# Patient Record
Sex: Female | Born: 1985 | Race: Black or African American | Hispanic: No | Smoking: Never smoker
Health system: Southern US, Community
[De-identification: ages and names within clinical notes are randomized; demographics above are authoritative.]

## PROBLEM LIST (undated history)

## (undated) HISTORY — PX: CHOLECYSTECTOMY: SHX55

## (undated) HISTORY — PX: TUBAL LIGATION: SHX77

---

## 2019-10-18 DIAGNOSIS — Z23 Encounter for immunization: Secondary | ICD-10-CM | POA: Diagnosis not present

## 2020-01-06 DIAGNOSIS — Z87440 Personal history of urinary (tract) infections: Secondary | ICD-10-CM | POA: Diagnosis not present

## 2020-01-06 DIAGNOSIS — R350 Frequency of micturition: Secondary | ICD-10-CM | POA: Diagnosis not present

## 2020-01-06 DIAGNOSIS — R8290 Unspecified abnormal findings in urine: Secondary | ICD-10-CM | POA: Diagnosis not present

## 2020-01-31 DIAGNOSIS — J029 Acute pharyngitis, unspecified: Secondary | ICD-10-CM | POA: Diagnosis not present

## 2020-02-17 ENCOUNTER — Other Ambulatory Visit: Payer: Self-pay

## 2020-02-17 ENCOUNTER — Encounter: Payer: Self-pay | Admitting: Advanced Practice Midwife

## 2020-02-17 ENCOUNTER — Ambulatory Visit (INDEPENDENT_AMBULATORY_CARE_PROVIDER_SITE_OTHER): Payer: Medicaid Other | Admitting: Advanced Practice Midwife

## 2020-02-17 ENCOUNTER — Other Ambulatory Visit (HOSPITAL_COMMUNITY)
Admission: RE | Admit: 2020-02-17 | Discharge: 2020-02-17 | Disposition: A | Discharge status: Home / Self Care | Payer: Medicaid Other | Source: Ambulatory Visit | Attending: Advanced Practice Midwife | Admitting: Advanced Practice Midwife

## 2020-02-17 VITALS — BP 111/72 | HR 64 | Ht 65.0 in | Wt 212.6 lb

## 2020-02-17 DIAGNOSIS — Z113 Encounter for screening for infections with a predominantly sexual mode of transmission: Secondary | ICD-10-CM | POA: Diagnosis not present

## 2020-02-17 DIAGNOSIS — Z01419 Encounter for gynecological examination (general) (routine) without abnormal findings: Secondary | ICD-10-CM | POA: Diagnosis not present

## 2020-02-17 DIAGNOSIS — Z9851 Tubal ligation status: Secondary | ICD-10-CM | POA: Diagnosis not present

## 2020-02-17 NOTE — Patient Instructions (Signed)
Preventive Care 21-36 Years Old, Female Preventive care refers to visits with your health care provider and lifestyle choices that can promote health and wellness. This includes:  A yearly physical exam. This may also be called an annual well check.  Regular dental visits and eye exams.  Immunizations.  Screening for certain conditions.  Healthy lifestyle choices, such as eating a healthy diet, getting regular exercise, not using drugs or products that contain nicotine and tobacco, and limiting alcohol use. What can I expect for my preventive care visit? Physical exam Your health care provider will check your:  Height and weight. This may be used to calculate body mass index (BMI), which tells if you are at a healthy weight.  Heart rate and blood pressure.  Skin for abnormal spots. Counseling Your health care provider may ask you questions about your:  Alcohol, tobacco, and drug use.  Emotional well-being.  Home and relationship well-being.  Sexual activity.  Eating habits.  Work and work environment.  Method of birth control.  Menstrual cycle.  Pregnancy history. What immunizations do I need?  Influenza (flu) vaccine  This is recommended every year. Tetanus, diphtheria, and pertussis (Tdap) vaccine  You may need a Td booster every 10 years. Varicella (chickenpox) vaccine  You may need this if you have not been vaccinated. Human papillomavirus (HPV) vaccine  If recommended by your health care provider, you may need three doses over 6 months. Measles, mumps, and rubella (MMR) vaccine  You may need at least one dose of MMR. You may also need a second dose. Meningococcal conjugate (MenACWY) vaccine  One dose is recommended if you are age 19-21 years and a first-year college student living in a residence hall, or if you have one of several medical conditions. You may also need additional booster doses. Pneumococcal conjugate (PCV13) vaccine  You may need  this if you have certain conditions and were not previously vaccinated. Pneumococcal polysaccharide (PPSV23) vaccine  You may need one or two doses if you smoke cigarettes or if you have certain conditions. Hepatitis A vaccine  You may need this if you have certain conditions or if you travel or work in places where you may be exposed to hepatitis A. Hepatitis B vaccine  You may need this if you have certain conditions or if you travel or work in places where you may be exposed to hepatitis B. Haemophilus influenzae type b (Hib) vaccine  You may need this if you have certain conditions. You may receive vaccines as individual doses or as more than one vaccine together in one shot (combination vaccines). Talk with your health care provider about the risks and benefits of combination vaccines. What tests do I need?  Blood tests  Lipid and cholesterol levels. These may be checked every 5 years starting at age 20.  Hepatitis C test.  Hepatitis B test. Screening  Diabetes screening. This is done by checking your blood sugar (glucose) after you have not eaten for a while (fasting).  Sexually transmitted disease (STD) testing.  BRCA-related cancer screening. This may be done if you have a family history of breast, ovarian, tubal, or peritoneal cancers.  Pelvic exam and Pap test. This may be done every 3 years starting at age 21. Starting at age 30, this may be done every 5 years if you have a Pap test in combination with an HPV test. Talk with your health care provider about your test results, treatment options, and if necessary, the need for more tests.   Follow these instructions at home: Eating and drinking   Eat a diet that includes fresh fruits and vegetables, whole grains, lean protein, and low-fat dairy.  Take vitamin and mineral supplements as recommended by your health care provider.  Do not drink alcohol if: ? Your health care provider tells you not to drink. ? You are  pregnant, may be pregnant, or are planning to become pregnant.  If you drink alcohol: ? Limit how much you have to 0-1 drink a day. ? Be aware of how much alcohol is in your drink. In the [place] one drink equals one 12 oz bottle of beer (355 mL), one 5 oz glass of wine (148 mL), or one 1 oz glass of hard liquor (44 mL). Lifestyle  Take daily care of your teeth and gums.  Stay active. Exercise for at least 30 minutes on 5 or more days each week.  Do not use any products that contain nicotine or tobacco, such as cigarettes, e-cigarettes, and chewing tobacco. If you need help quitting, ask your health care provider.  If you are sexually active, practice safe sex. Use a condom or other form of birth control (contraception) in order to prevent pregnancy and STIs (sexually transmitted infections). If you plan to become pregnant, see your health care provider for a preconception visit. What's next?  Visit your health care provider once a year for a well check visit.  Ask your health care provider how often you should have your eyes and teeth checked.  Stay up to date on all vaccines. This information is not intended to replace advice given to you by your health care provider. Make sure you discuss any questions you have with your health care provider. Document Revised: 11/02/2017 Document Reviewed: 11/02/2017 Elsevier Patient Education  2020 [employer].

## 2020-02-17 NOTE — Progress Notes (Signed)
Subjective:     Michelle Parks is a 34 y.o. female here at [facility] for a routine exam.  Current complaints: none.  Personal health questionnaire reviewed: yes.  Do you have a primary care provider? yes Do you feel safe at home? yes   Flowsheet Row Office Visit from 02/17/2020 in [facility]  PHQ-2 Total Score 0     Risk factors for chronic health problems: Smoking: Quit 7+ years ago Alchohol/how much: socially, occasional Pt BMI: Body mass index is 35.38 kg/m.   Gynecologic History Patient's last menstrual period was 02/12/2020 (exact date). Contraception: tubal ligation Last Pap: 7+ years ago. Results were: normal Last mammogram: n/a.   Obstetric History OB History  Gravida Para Term Preterm AB Living  6       1 5   SAB IAB Ectopic Multiple Live Births    1          # Outcome Date GA Lbr Len/2nd Weight Sex Delivery Anes PTL Lv  6 Gravida           5 Gravida           4 Gravida           3 Gravida           2 Gravida           1 IAB              The following portions of the patient's history were reviewed and updated as appropriate: allergies, current medications, past family history, past medical history, past social history, past surgical history and problem list.  Review of Systems Pertinent items noted in HPI and remainder of comprehensive ROS otherwise negative.    Objective:   BP 111/72   Pulse 64   Ht 5\' 5"  (1.651 m)   Wt 212 lb 9.6 oz (96.4 kg)   LMP 02/12/2020 (Exact Date)   BMI 35.38 kg/m  VS reviewed, nursing note reviewed,  Constitutional: well developed, well nourished, no distress HEENT: normocephalic CV: normal rate Pulm/chest wall: normal effort Breast Exam: performed: right breast normal without mass, skin or nipple changes or axillary nodes, left breast normal without mass, skin or nipple changes or axillary nodes Abdomen: soft Neuro: alert and oriented x 3 Skin: warm, dry Psych: affect normal Pelvic  exam: Performed: Cervix pink, visually closed, without lesion, scant white creamy discharge, vaginal walls and external genitalia normal Bimanual exam: Cervix 0/long/high, firm, anterior, neg CMT, uterus nontender, nonenlarged, adnexa without tenderness, enlargement, or mass       Assessment/Plan:   1. Well woman exam with routine gynecological exam --No gyn concerns or problems.  Menses were heavy with more cramping in years following BTL but are lighter now and mild cramping only.  Regular menses monthly lasting ~ 7 days. --Abnormal Pap in past but last pap normal with last pregnancy ~ 7 years ago --Pap with hpv today  2. Screening examination for STD (sexually transmitted disease)   Follow up in: 1 year or as needed.   , CNM 9:44 AM

## 2020-02-17 NOTE — Progress Notes (Signed)
NGYN patient presents for Annual Exam.  LMP:02/12/20 lasting 7 days with light flow now, use to be heavy.   Last pap: > More than 3 yrs. Hx of abnormal paps in the past.   STD Screening: Desires Full Panel   Contraception: none had BTL   Family Hx of Breast Cancer: None    CC: None

## 2020-02-18 LAB — CERVICOVAGINAL ANCILLARY ONLY
Bacterial Vaginitis (gardnerella): POSITIVE — AB
Candida Glabrata: NEGATIVE
Candida Vaginitis: NEGATIVE
Chlamydia: NEGATIVE
Comment: NEGATIVE
Comment: NEGATIVE
Comment: NEGATIVE
Comment: NEGATIVE
Comment: NEGATIVE
Comment: NORMAL
Neisseria Gonorrhea: NEGATIVE
Trichomonas: NEGATIVE

## 2020-02-19 LAB — CYTOLOGY - PAP
Comment: NEGATIVE
Diagnosis: NEGATIVE
High risk HPV: NEGATIVE

## 2020-11-22 ENCOUNTER — Emergency Department (HOSPITAL_COMMUNITY)
Admission: EM | Admit: 2020-11-22 | Discharge: 2020-11-22 | Disposition: A | Discharge status: Home / Self Care | Payer: Medicaid Other | Attending: Emergency Medicine | Admitting: Emergency Medicine

## 2020-11-22 ENCOUNTER — Encounter (HOSPITAL_COMMUNITY): Payer: Self-pay

## 2020-11-22 ENCOUNTER — Other Ambulatory Visit: Payer: Self-pay

## 2020-11-22 DIAGNOSIS — B9620 Unspecified Escherichia coli [E. coli] as the cause of diseases classified elsewhere: Secondary | ICD-10-CM | POA: Insufficient documentation

## 2020-11-22 DIAGNOSIS — R350 Frequency of micturition: Secondary | ICD-10-CM

## 2020-11-22 DIAGNOSIS — N390 Urinary tract infection, site not specified: Secondary | ICD-10-CM | POA: Insufficient documentation

## 2020-11-22 DIAGNOSIS — R1030 Lower abdominal pain, unspecified: Secondary | ICD-10-CM | POA: Insufficient documentation

## 2020-11-22 LAB — URINALYSIS, ROUTINE W REFLEX MICROSCOPIC
Bilirubin Urine: NEGATIVE
Glucose, UA: NEGATIVE mg/dL
Hgb urine dipstick: NEGATIVE
Ketones, ur: NEGATIVE mg/dL
Leukocytes,Ua: NEGATIVE
Nitrite: NEGATIVE
Protein, ur: NEGATIVE mg/dL
Specific Gravity, Urine: 1.02 (ref 1.005–1.030)
pH: 7 (ref 5.0–8.0)

## 2020-11-22 MED ORDER — CEPHALEXIN 500 MG PO CAPS: 500.0000 mg | ORAL_CAPSULE | Freq: Four times a day (QID) | ORAL | 0 refills | Status: DC

## 2020-11-22 NOTE — ED Provider Notes (Signed)
[facility]   CSN: [id number] Arrival date & time: 11/22/20  1401     History Chief Complaint  Patient presents with   Possible UTI    Michelle Parks is a 35 y.o. female.  HPI  Patient reports with increased urinary frequency.  This is been going on the last 3 weeks, no hematuria or dysuria.  Patient has history of frequent UTIs, [place] ago [place] and has not yet have a primary care physician.  She reports some abdominal cramping associated with urination, it comes and goes and is classic with her presentation.  Denies any nausea or vomiting.  Has not had any fevers at home.  She has tried some Tylenol without any relief.    No past medical history on file.  Patient Active Problem List   Diagnosis Date Noted   Status post tubal ligation 02/17/2020    No past surgical history on file.   OB History     Gravida  6   Para      Term      Preterm      AB  1   Living  5      SAB      IAB  1   Ectopic      Multiple      Live Births              Family History  Problem Relation Age of Onset   Hypertension Mother    Diabetes Mother     Social History   Tobacco Use   Smoking status: Never   Smokeless tobacco: Never  Vaping Use   Vaping Use: Never used  Substance Use Topics   Alcohol use: Yes   Drug use: Never    Home Medications Prior to Admission medications   Not on File    Allergies    Patient has no allergy information on record.  Review of Systems   Review of Systems  Constitutional:  Negative for fever.  Genitourinary:  Positive for frequency and urgency. Negative for dysuria and flank pain.   Physical Exam Updated Vital Signs There were no vitals taken for this visit.  Physical Exam Vitals and nursing note reviewed. Exam conducted with a chaperone present.  Constitutional:      General: She is not in acute distress.    Appearance: Normal appearance.   HENT:     Head: Normocephalic and atraumatic.  Eyes:     General: No scleral icterus.    Extraocular Movements: Extraocular movements intact.     Pupils: Pupils are equal, round, and reactive to light.  Abdominal:     Comments: Some suprapubic tenderness.  Skin:    Coloration: Skin is not jaundiced.  Neurological:     Mental Status: She is alert. Mental status is at baseline.     Coordination: Coordination normal.    ED Results / Procedures / Treatments   Labs (all labs ordered are listed, but only abnormal results are displayed) Labs Reviewed - No data to display  EKG None  Radiology No results found.  Procedures Procedures   Medications Ordered in ED Medications - No data to display  ED Course  I have reviewed the triage vital signs and the nursing notes.  Pertinent labs & imaging results that were available during my care of the patient were reviewed by me and considered in my medical decision making (see chart for details).  MDM Rules/Calculators/A&P                           Patient vital stable, afebrile.  No tachycardia.  Doubt pyelonephritis.  History of frequent UTIs and UTI symptoms, high suspicion this is a classic UTI.  Will get UA and culture.    UA is negative, culture still pending.  Given patient is symptomatic and has consistent history of UTIs we will treat empirically with antibiotics.  Will give patient information for urology for additional evaluation, also given information for primary care provider in the area.  She not having any abdominal pain, abdomen is soft.  Do not think this is acute abdomen.  Her vitals are stable, she is resting comfortably.  No pelvic pain, no missed periods, no abnormal discharge.  Will discharge with antibiotic and have her follow-up outpatient.  Final Clinical Impression(s) / ED Diagnoses Final diagnoses:  None    Rx / DC Orders ED Discharge Orders     None        Theron Arista, [place] 11/22/20 1703     Bethann Berkshire, MD 11/23/20 1737

## 2020-11-22 NOTE — ED Triage Notes (Signed)
Pt reports 3 weeks lower back pain, abdominal discomfort and strong odor from her urine. Pt reports recurrent UTIs.

## 2020-11-22 NOTE — Discharge Instructions (Addendum)
Take Keflex 4 times daily for the next 5 days.  If your urine grows any abnormal bacteria and you need a different antibiotic you will receive a call from the hospital.  Please schedule follow-up with urology for additional evaluation for the cause of your frequent urinary tract infections.  I have also given you information for a primary care provider.  Return if things worsen or fail to improve.

## 2020-11-23 ENCOUNTER — Telehealth: Payer: Self-pay

## 2020-11-23 NOTE — Telephone Encounter (Signed)
Transition Care Management Unsuccessful Follow-up Telephone Call  Date of discharge and from where:  11/22/2020-[place]   Attempts:  1st Attempt  Reason for unsuccessful TCM follow-up call:  Left voice message    

## 2020-11-24 LAB — URINE CULTURE: Culture: 70000 — AB

## 2020-11-24 NOTE — Telephone Encounter (Signed)
Transition Care Management Unsuccessful Follow-up Telephone Call  Date of discharge and from where:  11/22/2020-[place]  Attempts:  2nd Attempt  Reason for unsuccessful TCM follow-up call:  Left voice message

## 2020-11-25 ENCOUNTER — Telehealth: Payer: Self-pay | Admitting: *Deleted

## 2020-11-25 NOTE — Telephone Encounter (Signed)
Post ED Visit - Positive Culture Follow-up  Culture report reviewed by antimicrobial stewardship pharmacist: [facility] []  , Pharm.D. []  Enzo Bi, Pharm.D., BCPS AQ-ID []  , Pharm.D., BCPS []  Celedonio Miyamoto, Pharm.D., BCPS []  [place], Garvin Fila.D., BCPS, AAHIVP []  , Pharm.D., BCPS, AAHIVP []  Georgina Pillion, PharmD, BCPS []  , PharmD, BCPS []  [place], PharmD, BCPS []  [address], PharmD []  , PharmD, BCPS []  Estella Husk, PharmD  Pharmacy Team []  Lysle Pearl, PharmD []  , PharmD []  Phillips Climes, PharmD []  , Rph []  Agapito Games) , PharmD []  Verlan Friends, PharmD []  , PharmD []  Mervyn Gay, PharmD []  , PharmD []  Vinnie Level, PharmD []  [facility], PharmD []  , PharmD []  Len Childs, PharmD   Positive urine culture Treated with Cephalexin, organism sensitive to the same and no further patient follow-up is required at this time.  Student Pharm  Greer Pickerel Talley 11/25/2020, 10:43 AM

## 2020-11-26 NOTE — Telephone Encounter (Signed)
Transition Care Management Follow-up Telephone Call Date of discharge and from where: 11/22/2020 from [facility] How have you been since you were released from the hospital? Pt stated that she is feeling better and did not have any questions or concerns.  Any questions or concerns? No  Items Reviewed: Did the pt receive and understand the discharge instructions provided? Yes  Medications obtained and verified? Yes  Other? No  Any new allergies since your discharge? No  Dietary orders reviewed? No Do you have support at home? Yes    Follow up appointments reviewed:  PCP Hospital f/u appt confirmed? No  Pt is calling to establish care.  Specialist Hospital f/u appt confirmed? No  Pt is calling for her follow up appt.  Are transportation arrangements needed? No  If their condition worsens, is the pt aware to call PCP or go to the Emergency Dept.? Yes Was the patient provided with contact information for the PCP's office or ED? Yes Was to pt encouraged to call back with questions or concerns? Yes

## 2020-12-15 DIAGNOSIS — R300 Dysuria: Secondary | ICD-10-CM | POA: Diagnosis not present

## 2020-12-15 DIAGNOSIS — N390 Urinary tract infection, site not specified: Secondary | ICD-10-CM | POA: Diagnosis not present

## 2020-12-22 DIAGNOSIS — R350 Frequency of micturition: Secondary | ICD-10-CM | POA: Diagnosis not present

## 2020-12-22 DIAGNOSIS — Z1159 Encounter for screening for other viral diseases: Secondary | ICD-10-CM | POA: Diagnosis not present

## 2020-12-22 DIAGNOSIS — E559 Vitamin D deficiency, unspecified: Secondary | ICD-10-CM | POA: Diagnosis not present

## 2020-12-22 DIAGNOSIS — R5383 Other fatigue: Secondary | ICD-10-CM | POA: Diagnosis not present

## 2020-12-22 DIAGNOSIS — Z131 Encounter for screening for diabetes mellitus: Secondary | ICD-10-CM | POA: Diagnosis not present

## 2020-12-22 DIAGNOSIS — Z0000 Encounter for general adult medical examination without abnormal findings: Secondary | ICD-10-CM | POA: Diagnosis not present

## 2020-12-22 DIAGNOSIS — E7800 Pure hypercholesterolemia, unspecified: Secondary | ICD-10-CM | POA: Diagnosis not present

## 2020-12-22 DIAGNOSIS — Z114 Encounter for screening for human immunodeficiency virus [HIV]: Secondary | ICD-10-CM | POA: Diagnosis not present

## 2020-12-24 DIAGNOSIS — R7303 Prediabetes: Secondary | ICD-10-CM | POA: Diagnosis not present

## 2020-12-24 DIAGNOSIS — E559 Vitamin D deficiency, unspecified: Secondary | ICD-10-CM | POA: Diagnosis not present

## 2020-12-24 DIAGNOSIS — Z111 Encounter for screening for respiratory tuberculosis: Secondary | ICD-10-CM | POA: Diagnosis not present

## 2020-12-31 DIAGNOSIS — Z111 Encounter for screening for respiratory tuberculosis: Secondary | ICD-10-CM | POA: Diagnosis not present

## 2020-12-31 DIAGNOSIS — Z1159 Encounter for screening for other viral diseases: Secondary | ICD-10-CM | POA: Diagnosis not present

## 2021-01-02 DIAGNOSIS — Z020 Encounter for examination for admission to educational institution: Secondary | ICD-10-CM | POA: Diagnosis not present

## 2021-01-02 DIAGNOSIS — Z111 Encounter for screening for respiratory tuberculosis: Secondary | ICD-10-CM | POA: Diagnosis not present

## 2021-01-20 ENCOUNTER — Emergency Department (HOSPITAL_COMMUNITY)
Admission: EM | Admit: 2021-01-20 | Discharge: 2021-01-21 | Disposition: A | Discharge status: Home / Self Care | Payer: Medicaid Other | Attending: Emergency Medicine | Admitting: Emergency Medicine

## 2021-01-20 ENCOUNTER — Emergency Department (HOSPITAL_COMMUNITY): Payer: Medicaid Other

## 2021-01-20 DIAGNOSIS — M545 Low back pain, unspecified: Secondary | ICD-10-CM | POA: Insufficient documentation

## 2021-01-20 DIAGNOSIS — R8281 Pyuria: Secondary | ICD-10-CM | POA: Insufficient documentation

## 2021-01-20 DIAGNOSIS — N390 Urinary tract infection, site not specified: Secondary | ICD-10-CM | POA: Insufficient documentation

## 2021-01-20 DIAGNOSIS — R300 Dysuria: Secondary | ICD-10-CM | POA: Insufficient documentation

## 2021-01-20 DIAGNOSIS — R3129 Other microscopic hematuria: Secondary | ICD-10-CM | POA: Diagnosis not present

## 2021-01-20 DIAGNOSIS — R1030 Lower abdominal pain, unspecified: Secondary | ICD-10-CM | POA: Insufficient documentation

## 2021-01-20 DIAGNOSIS — R109 Unspecified abdominal pain: Secondary | ICD-10-CM | POA: Diagnosis not present

## 2021-01-20 LAB — CBC WITH DIFFERENTIAL/PLATELET
Abs Immature Granulocytes: 0.04 10*3/uL (ref 0.00–0.07)
Basophils Absolute: 0.1 10*3/uL (ref 0.0–0.1)
Basophils Relative: 0 %
Eosinophils Absolute: 0.2 10*3/uL (ref 0.0–0.5)
Eosinophils Relative: 1 %
HCT: 33.2 % — ABNORMAL LOW (ref 36.0–46.0)
Hemoglobin: 10.1 g/dL — ABNORMAL LOW (ref 12.0–15.0)
Immature Granulocytes: 0 %
Lymphocytes Relative: 27 %
Lymphs Abs: 3.7 10*3/uL (ref 0.7–4.0)
MCH: 23.1 pg — ABNORMAL LOW (ref 26.0–34.0)
MCHC: 30.4 g/dL (ref 30.0–36.0)
MCV: 76 fL — ABNORMAL LOW (ref 80.0–100.0)
Monocytes Absolute: 1.1 10*3/uL — ABNORMAL HIGH (ref 0.1–1.0)
Monocytes Relative: 8 %
Neutro Abs: 8.8 10*3/uL — ABNORMAL HIGH (ref 1.7–7.7)
Neutrophils Relative %: 64 %
Platelets: 299 10*3/uL (ref 150–400)
RBC: 4.37 MIL/uL (ref 3.87–5.11)
RDW: 16.4 % — ABNORMAL HIGH (ref 11.5–15.5)
WBC: 13.8 10*3/uL — ABNORMAL HIGH (ref 4.0–10.5)
nRBC: 0 % (ref 0.0–0.2)

## 2021-01-20 LAB — URINALYSIS, ROUTINE W REFLEX MICROSCOPIC
Bacteria, UA: NONE SEEN
Bilirubin Urine: NEGATIVE
Glucose, UA: NEGATIVE mg/dL
Ketones, ur: NEGATIVE mg/dL
Nitrite: NEGATIVE
Protein, ur: 30 mg/dL — AB
RBC / HPF: >50 RBC/hpf — ABNORMAL HIGH (ref 0–5)
Specific Gravity, Urine: 1.011 (ref 1.005–1.030)
WBC, UA: >50 WBC/hpf — ABNORMAL HIGH (ref 0–5)
pH: 7 (ref 5.0–8.0)

## 2021-01-20 LAB — BASIC METABOLIC PANEL
Anion gap: 8 (ref 5–15)
BUN: 9 mg/dL (ref 6–20)
CO2: 23 mmol/L (ref 22–32)
Calcium: 9 mg/dL (ref 8.9–10.3)
Chloride: 104 mmol/L (ref 98–111)
Creatinine, Ser: 0.87 mg/dL (ref 0.44–1.00)
GFR, Estimated: >60 mL/min (ref >60)
Glucose, Bld: 101 mg/dL — ABNORMAL HIGH (ref 70–99)
Potassium: 4.2 mmol/L (ref 3.5–5.1)
Sodium: 135 mmol/L (ref 135–145)

## 2021-01-20 LAB — PREGNANCY, URINE: Preg Test, Ur: NEGATIVE

## 2021-01-20 NOTE — ED Triage Notes (Signed)
Pt c/o urinary symptoms- abd pain, lower back pain, dysuria, endorses hx UTI. States she took Azo pill but "it's not better, & it's the worst one yet."

## 2021-01-20 NOTE — ED Provider Notes (Signed)
Emergency Medicine Provider Triage Evaluation Note  [facility] , a 35 y.o. female  was evaluated in triage.  Pt complains of a "terrible UTI."  Reports she has recurrent UTIs.  Has had dysuria and abdominal pain since Monday.  Took Azo however it is not helping.  Last menstrual period 2 weeks ago.  Review of Systems  Positive: Abdominal pain, dysuria, chills, nausea Negative:   Physical Exam  BP (!) 142/69 (BP Location: Right Arm)   Pulse 99   Temp 99.3 F (37.4 C) (Oral)   Resp 18   SpO2 100%  Gen:   Awake, no distress   Resp:  Normal effort  MSK:   Moves extremities without difficulty  Other:  Periumbilical, suprapubic and lower quadrant tenderness.  Medical Decision Making  Medically screening exam initiated at 8:12 PM.  Appropriate orders placed.  Rose-Marie Watling was informed that the remainder of the evaluation will be completed by another provider, this initial triage assessment does not replace that evaluation, and the importance of remaining in the ED until their evaluation is complete.     Woodroe Chen 01/20/21 2013    Gerhard Munch, MD 01/20/21 2139

## 2021-01-21 ENCOUNTER — Emergency Department (HOSPITAL_COMMUNITY): Payer: Medicaid Other

## 2021-01-21 DIAGNOSIS — R109 Unspecified abdominal pain: Secondary | ICD-10-CM | POA: Diagnosis not present

## 2021-01-21 MED ORDER — IOHEXOL 300 MG/ML  SOLN
100.0000 mL | Freq: Once | INTRAMUSCULAR | Status: AC | PRN
  Administered 2021-01-21: 100 mL via INTRAVENOUS

## 2021-01-21 MED ORDER — NITROFURANTOIN MONOHYD MACRO 100 MG PO CAPS
100.0000 mg | ORAL_CAPSULE | Freq: Once | ORAL | Status: AC
  Administered 2021-01-21: 05:00:00 100 mg via ORAL
  Filled 2021-01-21 (×2): qty 1

## 2021-01-21 MED ORDER — NITROFURANTOIN MONOHYD MACRO 100 MG PO CAPS: 100.0000 mg | ORAL_CAPSULE | Freq: Two times a day (BID) | ORAL | 0 refills | Status: DC

## 2021-01-21 NOTE — Discharge Instructions (Signed)
Take Macrobid as prescribed until finished.  We recommend follow-up with urology given your recurrent urinary tract infections.  Return to the ED for new or concerning symptoms.

## 2021-01-21 NOTE — ED Provider Notes (Signed)
[facility] EMERGENCY DEPARTMENT Provider Note   CSN: [id number] Arrival date & time: 01/20/21  1912     History Chief Complaint  Patient presents with   urinary symptoms    Michelle Parks is a 35 y.o. female.  35 year old female presents to the emergency department for evaluation of abdominal pain.  She reports acute onset of waxing and waning pain in her lower abdomen and back.  Symptoms began on Monday and have been intermittent, but worsened prior to arrival.  She notes associated dysuria.  Took AZO with little improvement.  She has had similar discomfort in the past associated with urinary tract infection.  Most recently was treated for a UTI 2 weeks ago.  Has not had any fevers, vomiting, inability to void, bowel changes.  Presently reports that she is pain-free.  The history is provided by the patient. No language interpreter was used.      No past medical history on file.  Patient Active Problem List   Diagnosis Date Noted   Status post tubal ligation 02/17/2020    No past surgical history on file.   OB History     Gravida  6   Para      Term      Preterm      AB  1   Living  5      SAB      IAB  1   Ectopic      Multiple      Live Births              Family History  Problem Relation Age of Onset   Hypertension Mother    Diabetes Mother     Social History   Tobacco Use   Smoking status: Never   Smokeless tobacco: Never  Vaping Use   Vaping Use: Never used  Substance Use Topics   Alcohol use: Yes   Drug use: Never    Home Medications Prior to Admission medications   Medication Sig Start Date End Date Taking? Authorizing Provider  nitrofurantoin, macrocrystal-monohydrate, (MACROBID) 100 MG capsule Take 1 capsule (100 mg total) by mouth 2 (two) times daily. 01/21/21  Yes Antonietta Breach, PA-C    Allergies    Patient has no known allergies.  Review of Systems   Review of Systems Ten systems reviewed and  are negative for acute change, except as noted in the HPI.    Physical Exam Updated Vital Signs BP 120/74   Pulse 74   Temp 98.3 F (36.8 C) (Oral)   Resp 15   SpO2 99%   Physical Exam Vitals and nursing note reviewed.  Constitutional:      General: She is not in acute distress.    Appearance: She is well-developed. She is not diaphoretic.     Comments: Nontoxic appearing and in NAD  HENT:     Head: Normocephalic and atraumatic.  Eyes:     General: No scleral icterus.    Conjunctiva/sclera: Conjunctivae normal.  Pulmonary:     Effort: Pulmonary effort is normal. No respiratory distress.     Comments: Respirations even and unlabored Abdominal:     General: There is no distension.     Palpations: Abdomen is soft.  Musculoskeletal:        General: Normal range of motion.     Cervical back: Normal range of motion.  Skin:    General: Skin is warm and dry.     Coloration: Skin is not  pale.     Findings: No erythema or rash.  Neurological:     Mental Status: She is alert and oriented to person, place, and time.     Coordination: Coordination normal.  Psychiatric:        Behavior: Behavior normal.    ED Results / Procedures / Treatments   Labs (all labs ordered are listed, but only abnormal results are displayed) Labs Reviewed  URINALYSIS, ROUTINE W REFLEX MICROSCOPIC - Abnormal; Notable for the following components:      Result Value   APPearance CLOUDY (*)    Hgb urine dipstick MODERATE (*)    Protein, ur 30 (*)    Leukocytes,Ua LARGE (*)    RBC / HPF >50 (*)    WBC, UA >50 (*)    Non Squamous Epithelial 0-5 (*)    All other components within normal limits  BASIC METABOLIC PANEL - Abnormal; Notable for the following components:   Glucose, Bld 101 (*)    All other components within normal limits  CBC WITH DIFFERENTIAL/PLATELET - Abnormal; Notable for the following components:   WBC 13.8 (*)    Hemoglobin 10.1 (*)    HCT 33.2 (*)    MCV 76.0 (*)    MCH 23.1  (*)    RDW 16.4 (*)    Neutro Abs 8.8 (*)    Monocytes Absolute 1.1 (*)    All other components within normal limits  URINE CULTURE  PREGNANCY, URINE    EKG None  Radiology CT ABDOMEN PELVIS W CONTRAST  Result Date: 01/21/2021 CLINICAL DATA:  Abdominal pain. EXAM: CT ABDOMEN AND PELVIS WITH CONTRAST TECHNIQUE: Multidetector CT imaging of the abdomen and pelvis was performed using the standard protocol following bolus administration of intravenous contrast. CONTRAST:  OMNIPAQUE IOHEXOL 300 MG/ML  SOLN COMPARISON:  None. FINDINGS: Lower chest: The visualized lung bases are clear. No intra-abdominal free air or free fluid. Hepatobiliary: The liver is unremarkable. No intrahepatic biliary dilatation. Cholecystectomy. Pancreas: Unremarkable. No pancreatic ductal dilatation or surrounding inflammatory changes. Spleen: Normal in size without focal abnormality. Adrenals/Urinary Tract: The adrenal glands unremarkable. The kidneys, visualized ureters appear unremarkable. There is diffuse hazy appearance of the bladder wall. Correlation with urinalysis recommended to evaluate for cystitis. Stomach/Bowel: There is moderate stool throughout the colon. No bowel obstruction or active inflammation. The appendix is normal. Vascular/Lymphatic: The abdominal aorta and IVC unremarkable. No portal venous gas. There is no adenopathy. Reproductive: The uterus is anteverted and grossly unremarkable. No adnexal masses. Other: None Musculoskeletal: No acute or significant osseous findings. IMPRESSION: 1. Hazy appearance of the bladder wall. Correlation with urinalysis recommended to evaluate for cystitis. 2. No bowel obstruction. Normal appendix. Electronically Signed   By: Elgie Collard M.D.   On: 01/21/2021 00:25    Procedures Procedures   Medications Ordered in ED Medications  iohexol (OMNIPAQUE) 300 MG/ML solution 100 mL (100 mLs Intravenous Contrast Given 01/21/21 0014)  nitrofurantoin  (macrocrystal-monohydrate) (MACROBID) capsule 100 mg (100 mg Oral Given 01/21/21 8938)    ED Course  I have reviewed the triage vital signs and the nursing notes.  Pertinent labs & imaging results that were available during my care of the patient were reviewed by me and considered in my medical decision making (see chart for details).    MDM Rules/Calculators/A&P                           Patient has been diagnosed with a UTI;  UA notable for pyuria without bacteriuria. She is afebrile without CVA tenderness, normotensive, and denies N/V. Per review of Epic Chart, urine culture in September 2022 notable for pan-sensitive E. Coli. Patient to be discharged home with Macrobid and instructions to follow up with Urology given recent recurrent UTIs. Return precautions discussed and provided. Patient discharged in stable condition with no unaddressed concerns.   Final Clinical Impression(s) / ED Diagnoses Final diagnoses:  Pyuria  Other microscopic hematuria    Rx / DC Orders ED Discharge Orders          Ordered    nitrofurantoin, macrocrystal-monohydrate, (MACROBID) 100 MG capsule  2 times daily        01/21/21 0500             Antonietta Breach, PA-C 01/21/21 0541    Ezequiel Essex, MD 01/21/21 [phone]

## 2021-01-21 NOTE — ED Notes (Signed)
RN reviewed discharge instructions with pt. Pt verbalized understanding and had no further questions. VSS upon discharge.  

## 2021-01-23 LAB — URINE CULTURE: Culture: >=100000 — AB

## 2021-01-24 ENCOUNTER — Telehealth: Payer: Self-pay | Admitting: Emergency Medicine

## 2021-01-24 NOTE — Telephone Encounter (Unsigned)
Post ED Visit - Positive Culture Follow-up  Culture report reviewed by antimicrobial stewardship pharmacist: [facility] []  Nathan Batchelder, Pharm.D. []  , Pharm.D., BCPS AQ-ID []  , Pharm.D., BCPS []  Celedonio Miyamoto, Pharm.D., BCPS []  [place], Garvin Fila.D., BCPS, AAHIVP []  , Pharm.D., BCPS, AAHIVP []  Georgina Pillion, PharmD, BCPS []  , PharmD, BCPS []  [place], PharmD, BCPS [x]  [address], PharmD []  , PharmD, BCPS []  Estella Husk, PharmD  Pharmacy Team []  Lysle Pearl, PharmD []  , PharmD []  Phillips Climes, PharmD []  , Rph []  Agapito Games) , PharmD []  Delmar Landau, PharmD []  , PharmD []  Mervyn Gay, PharmD []  , PharmD []  Vinnie Level, PharmD []  [facility], PharmD []  , PharmD []  Len Childs, PharmD   Positive Nitrofurantoin culture Treated with urine, organism sensitive to the same and no further patient follow-up is required at this time.  Michelle Parks 01/24/2021, 2:05 PM

## 2021-01-30 NOTE — Progress Notes (Signed)
Erroneous encounter

## 2021-02-05 ENCOUNTER — Encounter: Payer: Medicaid Other | Admitting: Family

## 2021-02-05 DIAGNOSIS — Z7689 Persons encountering health services in other specified circumstances: Secondary | ICD-10-CM

## 2021-02-05 DIAGNOSIS — Z87440 Personal history of urinary (tract) infections: Secondary | ICD-10-CM | POA: Diagnosis not present

## 2021-04-05 DIAGNOSIS — F329 Major depressive disorder, single episode, unspecified: Secondary | ICD-10-CM | POA: Diagnosis not present

## 2021-04-12 DIAGNOSIS — Z114 Encounter for screening for human immunodeficiency virus [HIV]: Secondary | ICD-10-CM | POA: Diagnosis not present

## 2021-04-12 DIAGNOSIS — Z202 Contact with and (suspected) exposure to infections with a predominantly sexual mode of transmission: Secondary | ICD-10-CM | POA: Diagnosis not present

## 2021-04-12 DIAGNOSIS — Z113 Encounter for screening for infections with a predominantly sexual mode of transmission: Secondary | ICD-10-CM | POA: Diagnosis not present

## 2021-04-13 DIAGNOSIS — F329 Major depressive disorder, single episode, unspecified: Secondary | ICD-10-CM | POA: Diagnosis not present

## 2021-04-27 DIAGNOSIS — F329 Major depressive disorder, single episode, unspecified: Secondary | ICD-10-CM | POA: Diagnosis not present

## 2021-05-03 DIAGNOSIS — F329 Major depressive disorder, single episode, unspecified: Secondary | ICD-10-CM | POA: Diagnosis not present

## 2021-05-13 DIAGNOSIS — F329 Major depressive disorder, single episode, unspecified: Secondary | ICD-10-CM | POA: Diagnosis not present

## 2021-05-26 DIAGNOSIS — F329 Major depressive disorder, single episode, unspecified: Secondary | ICD-10-CM | POA: Diagnosis not present

## 2021-06-01 DIAGNOSIS — F329 Major depressive disorder, single episode, unspecified: Secondary | ICD-10-CM | POA: Diagnosis not present

## 2021-06-08 DIAGNOSIS — F329 Major depressive disorder, single episode, unspecified: Secondary | ICD-10-CM | POA: Diagnosis not present

## 2021-06-15 DIAGNOSIS — F329 Major depressive disorder, single episode, unspecified: Secondary | ICD-10-CM | POA: Diagnosis not present

## 2021-06-23 DIAGNOSIS — F329 Major depressive disorder, single episode, unspecified: Secondary | ICD-10-CM | POA: Diagnosis not present

## 2021-07-05 DIAGNOSIS — F329 Major depressive disorder, single episode, unspecified: Secondary | ICD-10-CM | POA: Diagnosis not present

## 2021-07-14 DIAGNOSIS — F329 Major depressive disorder, single episode, unspecified: Secondary | ICD-10-CM | POA: Diagnosis not present

## 2021-12-12 ENCOUNTER — Ambulatory Visit
Admission: EM | Admit: 2021-12-12 | Discharge: 2021-12-12 | Disposition: A | Discharge status: Home / Self Care | Payer: Medicaid Other | Attending: Internal Medicine | Admitting: Internal Medicine

## 2021-12-12 DIAGNOSIS — R8290 Unspecified abnormal findings in urine: Secondary | ICD-10-CM | POA: Diagnosis present

## 2021-12-12 DIAGNOSIS — J069 Acute upper respiratory infection, unspecified: Secondary | ICD-10-CM | POA: Diagnosis not present

## 2021-12-12 LAB — POCT URINALYSIS DIP (MANUAL ENTRY)
Bilirubin, UA: NEGATIVE
Glucose, UA: NEGATIVE mg/dL
Ketones, POC UA: NEGATIVE mg/dL
Leukocytes, UA: NEGATIVE
Nitrite, UA: NEGATIVE
Protein Ur, POC: NEGATIVE mg/dL
Spec Grav, UA: 1.025 (ref 1.010–1.025)
Urobilinogen, UA: 0.2 E.U./dL
pH, UA: 6.5 (ref 5.0–8.0)

## 2021-12-12 MED ORDER — CETIRIZINE HCL 10 MG PO TABS: 10.0000 mg | ORAL_TABLET | Freq: Every day | ORAL | 0 refills | Status: DC

## 2021-12-12 MED ORDER — FLUTICASONE PROPIONATE 50 MCG/ACT NA SUSP
1.0000 | Freq: Every day | NASAL | 0 refills | Status: AC
Start: 2021-12-12 — End: 2021-12-15

## 2021-12-12 NOTE — Discharge Instructions (Signed)
Your urine did not show any signs of infection.  Your vaginal swab is pending.  We will call if it is abnormal.  It appears that you also has a viral upper respiratory infection that should run its course and self resolve with symptomatic treatment.  I have sent you medications to help alleviate the fluid in your ears and your upper respiratory infection.  Please follow-up if any symptoms persist or worsen.

## 2021-12-12 NOTE — ED Provider Notes (Signed)
[facility]    CSN: [id number] Arrival date & time: 12/12/21  1032      History   Chief Complaint Chief Complaint  Patient presents with   Nasal Congestion    HPI Michelle Parks is a 36 y.o. female.   Patient presents with nasal congestion, cough, bilateral ear pain that started about 4 days prior.  Patient reports she was around some children recently who tested positive for RSV. Denies any known fevers.  Denies chest pain, shortness of breath, sore throat, diarrhea, abdominal pain.  Patient has not taken any medication to help alleviate symptoms.  Also reporting some malodorous urine but denies urinary frequency, dysuria, vaginal discharge, abnormal vaginal bleeding, hematuria, abdominal pain, back pain, fever.  Patient reports that she has a history of recurrent urinary tract infections and is followed by urologist.  She does not take any daily preventative medications.  Patient states that she typically has a urinary tract infection after after sexual intercourse.  She denies any confirmed exposure to STD or any concern for this.  Last menstrual cycle was recently but patient is not sure of exact date.     History reviewed. No pertinent past medical history.  Patient Active Problem List   Diagnosis Date Noted   Status post tubal ligation 02/17/2020    History reviewed. No pertinent surgical history.  OB History     Gravida  6   Para      Term      Preterm      AB  1   Living  5      SAB      IAB  1   Ectopic      Multiple      Live Births               Home Medications    Prior to Admission medications   Medication Sig Start Date End Date Taking? Authorizing Provider  cetirizine (ZYRTEC) 10 MG tablet Take 1 tablet (10 mg total) by mouth daily. 12/12/21  Yes Axcel Horsch, Hildred Alamin E, FNP  fluticasone (FLONASE) 50 MCG/ACT nasal spray Place 1 spray into both nostrils daily for 3 days. 12/12/21 12/15/21 Yes [place] Delauder, Michele Rockers, FNP   nitrofurantoin, macrocrystal-monohydrate, (MACROBID) 100 MG capsule Take 1 capsule (100 mg total) by mouth 2 (two) times daily. 01/21/21   Antonietta Breach, PA-C    Family History Family History  Problem Relation Age of Onset   Hypertension Mother    Diabetes Mother     Social History Social History   Tobacco Use   Smoking status: Never   Smokeless tobacco: Never  Vaping Use   Vaping Use: Never used  Substance Use Topics   Alcohol use: Yes   Drug use: Never     Allergies   Patient has no known allergies.   Review of Systems Review of Systems Per HPI  Physical Exam Triage Vital Signs ED Triage Vitals [12/12/21 1136]  Enc Vitals Group     BP 138/81     Pulse Rate 72     Resp 16     Temp 98 F (36.7 C)     Temp Source Oral     SpO2 97 %     Weight      Height      Head Circumference      Peak Flow      Pain Score 7     Pain Loc      Pain Edu?  Excl. in GC?    No data found.  Updated Vital Signs BP 138/81 (BP Location: Left Arm)   Pulse 72   Temp 98 F (36.7 C) (Oral)   Resp 16   SpO2 97%   Visual Acuity Right Eye Distance:   Left Eye Distance:   Bilateral Distance:    Right Eye Near:   Left Eye Near:    Bilateral Near:     Physical Exam Constitutional:      General: She is not in acute distress.    Appearance: Normal appearance. She is not toxic-appearing or diaphoretic.  HENT:     Head: Normocephalic and atraumatic.     Right Ear: Ear canal normal. No drainage, swelling or tenderness. A middle ear effusion is present. There is no impacted cerumen. No foreign body. No mastoid tenderness. Tympanic membrane is not perforated, erythematous or bulging.     Left Ear: Ear canal normal. No drainage, swelling or tenderness. A middle ear effusion is present. There is no impacted cerumen. No foreign body. No mastoid tenderness. Tympanic membrane is not perforated, erythematous or bulging.     Nose: Congestion present.     Mouth/Throat:     Mouth:  Mucous membranes are moist.     Pharynx: No posterior oropharyngeal erythema.  Eyes:     Extraocular Movements: Extraocular movements intact.     Conjunctiva/sclera: Conjunctivae normal.     Pupils: Pupils are equal, round, and reactive to light.  Cardiovascular:     Rate and Rhythm: Normal rate and regular rhythm.     Pulses: Normal pulses.     Heart sounds: Normal heart sounds.  Pulmonary:     Effort: Pulmonary effort is normal. No respiratory distress.     Breath sounds: Normal breath sounds. No stridor. No wheezing, rhonchi or rales.  Abdominal:     General: Abdomen is flat. Bowel sounds are normal.     Palpations: Abdomen is soft.  Genitourinary:    Comments: Deferred with shared decision making.  Self swab performed. Musculoskeletal:        General: Normal range of motion.     Cervical back: Normal range of motion.  Skin:    General: Skin is warm and dry.  Neurological:     General: No focal deficit present.     Mental Status: She is alert and oriented to person, place, and time. Mental status is at baseline.  Psychiatric:        Mood and Affect: Mood normal.        Behavior: Behavior normal.      UC Treatments / Results  Labs (all labs ordered are listed, but only abnormal results are displayed) Labs Reviewed  POCT URINALYSIS DIP (MANUAL ENTRY) - Abnormal; Notable for the following components:      Result Value   Blood, UA small (*)    All other components within normal limits  CERVICOVAGINAL ANCILLARY ONLY    EKG   Radiology No results found.  Procedures Procedures (including critical care time)  Medications Ordered in UC Medications - No data to display  Initial Impression / Assessment and Plan / UC Course  I have reviewed the triage vital signs and the nursing notes.  Pertinent labs & imaging results that were available during my care of the patient were reviewed by me and considered in my medical decision making (see chart for details).      Patient presents with symptoms likely from a viral upper respiratory infection. Differential includes  bacterial pneumonia, sinusitis, allergic rhinitis, COVID-19, flu, RSV. Do not suspect underlying cardiopulmonary process. Symptoms seem unlikely related to ACS, CHF or COPD exacerbations, pneumonia, pneumothorax. Patient is nontoxic appearing and not in need of emergent medical intervention.  Patient declined viral testing.  Recommended symptom control with over the counter medications.  Patient sent prescriptions to alleviate symptoms and fluid behind TM.  UA dos not show any signs of urinary tract infection.  Will send cervicovaginal swab to ensure the odor is not from vaginitis.  Patient has no concern for STDs so testing for BV and yeast.  Return if symptoms fail to improve in 1-2 weeks or you develop shortness of breath, chest pain, severe headache. Patient states understanding and is agreeable.  Discharged with PCP followup.  Final Clinical Impressions(s) / UC Diagnoses   Final diagnoses:  Viral upper respiratory tract infection with cough  Malodorous urine     Discharge Instructions      Your urine did not show any signs of infection.  Your vaginal swab is pending.  We will call if it is abnormal.  It appears that you also has a viral upper respiratory infection that should run its course and self resolve with symptomatic treatment.  I have sent you medications to help alleviate the fluid in your ears and your upper respiratory infection.  Please follow-up if any symptoms persist or worsen.    ED Prescriptions     Medication Sig Dispense Auth. Provider   cetirizine (ZYRTEC) 10 MG tablet Take 1 tablet (10 mg total) by mouth daily. 30 tablet [place], Bassett E, [place]   fluticasone [facility]) 50 MCG/ACT nasal spray Place 1 spray into both nostrils daily for 3 days. 16 g Gustavus Bryant, [place]      PDMP not reviewed this encounter.   Gustavus Bryant, [place] 12/12/21 1348

## 2021-12-12 NOTE — ED Triage Notes (Signed)
Pt c/o bilat ear ache, nasal congestion with drainage, cough, onset ~ 4 days ago. Also c/o cloudy urine with odor.

## 2021-12-13 LAB — CERVICOVAGINAL ANCILLARY ONLY
Bacterial Vaginitis (gardnerella): POSITIVE — AB
Candida Glabrata: NEGATIVE
Candida Vaginitis: NEGATIVE
Comment: NEGATIVE
Comment: NEGATIVE
Comment: NEGATIVE

## 2021-12-14 ENCOUNTER — Telehealth (HOSPITAL_COMMUNITY): Payer: Self-pay | Admitting: Emergency Medicine

## 2021-12-14 MED ORDER — METRONIDAZOLE 500 MG PO TABS: 500.0000 mg | ORAL_TABLET | Freq: Two times a day (BID) | ORAL | 0 refills | Status: DC

## 2022-01-21 ENCOUNTER — Ambulatory Visit
Admission: EM | Admit: 2022-01-21 | Discharge: 2022-01-21 | Disposition: A | Discharge status: Home / Self Care | Payer: Medicaid Other | Attending: Physician Assistant | Admitting: Physician Assistant

## 2022-01-21 ENCOUNTER — Encounter: Payer: Self-pay | Admitting: Emergency Medicine

## 2022-01-21 DIAGNOSIS — R051 Acute cough: Secondary | ICD-10-CM | POA: Diagnosis present

## 2022-01-21 DIAGNOSIS — Z1152 Encounter for screening for COVID-19: Secondary | ICD-10-CM | POA: Diagnosis not present

## 2022-01-21 DIAGNOSIS — J069 Acute upper respiratory infection, unspecified: Secondary | ICD-10-CM

## 2022-01-21 LAB — RESP PANEL BY RT-PCR (FLU A&B, COVID) ARPGX2
Influenza A by PCR: NEGATIVE
Influenza B by PCR: NEGATIVE
SARS Coronavirus 2 by RT PCR: POSITIVE — AB

## 2022-01-21 MED ORDER — PSEUDOEPH-BROMPHEN-DM 30-2-10 MG/5ML PO SYRP: 5.0000 mL | ORAL_SOLUTION | Freq: Three times a day (TID) | ORAL | 0 refills | Status: DC | PRN

## 2022-01-21 NOTE — ED Provider Notes (Signed)
[facility]    CSN: [id number] Arrival date & time: 01/21/22  0902      History   Chief Complaint Chief Complaint  Patient presents with   Cough    HPI Michelle Parks is a 36 y.o. female.   The 89-year-old female presents with cough and congestion.  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  She indicates over the past 2 days she has been having increasing upper respiratory congestion with sinus congestion, bilateral ear congestion, rhinitis and postnasal drip which is clear production.  She also indicates she is having intermittent dizziness and off-balance sensation.  Patient also indicates she is having intermittent chest congestion and cough with clear production.  She indicates she is having some mild fatigue.  Denies fever or chills.  She indicates that she has been vaccinated against the flu and COVID but she relates she has not received the updated COVID-vaccine this year.  Is tolerating fluids well.   Cough Associated symptoms: rhinorrhea     History reviewed. No pertinent past medical history.  Patient Active Problem List   Diagnosis Date Noted   Status post tubal ligation 02/17/2020    History reviewed. No pertinent surgical history.  OB History     Gravida  6   Para      Term      Preterm      AB  1   Living  5      SAB      IAB  1   Ectopic      Multiple      Live Births               Home Medications    Prior to Admission medications   Medication Sig Start Date End Date Taking? Authorizing Provider  brompheniramine-pseudoephedrine-DM 30-2-10 MG/5ML syrup Take 5 mLs by mouth 3 (three) times daily as needed. 01/21/22  Yes Ellsworth Lennox, PA-C  cetirizine (ZYRTEC) 10 MG tablet Take 1 tablet (10 mg total) by mouth daily. 12/12/21   Gustavus Bryant, FNP  fluticasone (FLONASE) 50 MCG/ACT nasal spray Place 1 spray into both nostrils daily for 3 days. 12/12/21 12/15/21  Gustavus Bryant, FNP  metroNIDAZOLE  (FLAGYL) 500 MG tablet Take 1 tablet (500 mg total) by mouth 2 (two) times daily. 12/14/21   LampteyBritta Mccreedy, MD  nitrofurantoin, macrocrystal-monohydrate, (MACROBID) 100 MG capsule Take 1 capsule (100 mg total) by mouth 2 (two) times daily. 01/21/21   Antony Madura, PA-C    Family History Family History  Problem Relation Age of Onset   Hypertension Mother    Diabetes Mother     Social History Social History   Tobacco Use   Smoking status: Never   Smokeless tobacco: Never  Vaping Use   Vaping Use: Never used  Substance Use Topics   Alcohol use: Yes   Drug use: Never     Allergies   Patient has no known allergies.   Review of Systems Review of Systems  HENT:  Positive for postnasal drip, rhinorrhea and sinus pressure.   Respiratory:  Positive for cough.      Physical Exam Triage Vital Signs ED Triage Vitals [01/21/22 0917]  Enc Vitals Group     BP 129/82     Pulse Rate (!) 114     Resp 20     Temp 98.5 F (36.9 C)     Temp src      SpO2 97 %  Weight      Height      Head Circumference      Peak Flow      Pain Score 8     Pain Loc      Pain Edu?      Excl. in GC?    No data found.  Updated Vital Signs BP 129/82   Pulse (!) 114   Temp 98.5 F (36.9 C)   Resp 20   SpO2 97%   Visual Acuity Right Eye Distance:   Left Eye Distance:   Bilateral Distance:    Right Eye Near:   Left Eye Near:    Bilateral Near:     Physical Exam Constitutional:      Appearance: Normal appearance.  HENT:     Right Ear: Ear canal normal. Tympanic membrane is injected.     Left Ear: Ear canal normal. Tympanic membrane is injected.     Mouth/Throat:     Mouth: Mucous membranes are moist.     Pharynx: Oropharynx is clear.  Cardiovascular:     Rate and Rhythm: Normal rate and regular rhythm.     Heart sounds: Normal heart sounds.  Pulmonary:     Effort: Pulmonary effort is normal.     Breath sounds: Normal breath sounds and air entry. No wheezing, rhonchi or  rales.  Lymphadenopathy:     Cervical: No cervical adenopathy.  Neurological:     Mental Status: She is alert.      UC Treatments / Results  Labs (all labs ordered are listed, but only abnormal results are displayed) Labs Reviewed  RESP PANEL BY RT-PCR (FLU A&B, COVID) [id number]    EKG   Radiology No results found.  Procedures Procedures (including critical care time)  Medications Ordered in UC Medications - No data to display  Initial Impression / Assessment and Plan / UC Course  I have reviewed the triage vital signs and the nursing notes.  Pertinent labs & imaging results that were available during my care of the patient were reviewed by me and considered in my medical decision making (see chart for details).    Plan: 1.  The acute cough will be treated with the following: A.  Bromfed-DM every 8 hours to help control cough and congestion. 2.  The acute upper respiratory infection be treated with the following: A.  Bromfed-DM every 8 hours to help control cough and congestion. 3.  Screening for COVID-19 will be treated with the following: A.  Treatment will be modified depending on lab results from flu and COVID-19 test. 4.  Advised follow-up PCP or return to urgent care as needed. Final Clinical Impressions(s) / UC Diagnoses   Final diagnoses:  Acute cough  Acute upper respiratory infection  Encounter for screening for COVID-19     Discharge Instructions      Advised to use the Flonase nasal spray, 2 sprays each nostril once a day as this will help decrease the sinus and ear congestion. Advise use of Bromfed-DM, 1 teaspoon 3 times a day as needed for cough and congestion. Advised take ibuprofen or Tylenol for aches pains and discomfort. Advised follow-up PCP or return to urgent care if symptoms fail to improve  COVID and flu test will be completed in 48 hours.  If you do not receive a call from this office in 48 hours that indicates the test are negative.   Log onto MyChart to view the test results when they post in 48 hours.  ED Prescriptions     Medication Sig Dispense Auth. Provider   brompheniramine-pseudoephedrine-DM 30-2-10 MG/5ML syrup Take 5 mLs by mouth 3 (three) times daily as needed. 120 mL Ellsworth Lennox, PA-C      PDMP not reviewed this encounter.   Ellsworth Lennox, PA-C 01/21/22 [phone]

## 2022-01-21 NOTE — Discharge Instructions (Addendum)
Advised to use the Flonase nasal spray, 2 sprays each nostril once a day as this will help decrease the sinus and ear congestion. Advise use of Bromfed-DM, 1 teaspoon 3 times a day as needed for cough and congestion. Advised take ibuprofen or Tylenol for aches pains and discomfort. Advised follow-up PCP or return to urgent care if symptoms fail to improve  COVID and flu test will be completed in 48 hours.  If you do not receive a call from this office in 48 hours that indicates the test are negative.  Log onto MyChart to view the test results when they post in 48 hours.

## 2022-01-21 NOTE — ED Triage Notes (Signed)
Pt is present today with cough, body aches, HA, and dizziness x3 days

## 2022-04-02 IMAGING — CT CT ABD-PELV W/ CM
2 of 4 series · 17 of 46 positions shown, 19 images · IV contrast (omnipaque)
Comparison: None.

CLINICAL DATA: Abdominal pain.

EXAM:
CT ABDOMEN AND PELVIS WITH CONTRAST
TECHNIQUE: Multidetector CT imaging of the abdomen and pelvis was performed
using the standard protocol following bolus administration of
intravenous contrast.
CONTRAST:  100mL OMNIPAQUE IOHEXOL 300 MG/ML  SOLN

[Series 3: a/p w/ 5mm · axial · 0.83mm/px · z∈[+650,+1030]mm · 14 of 84 slices shown, 16 images]
[im 4/84  soft-tissue]
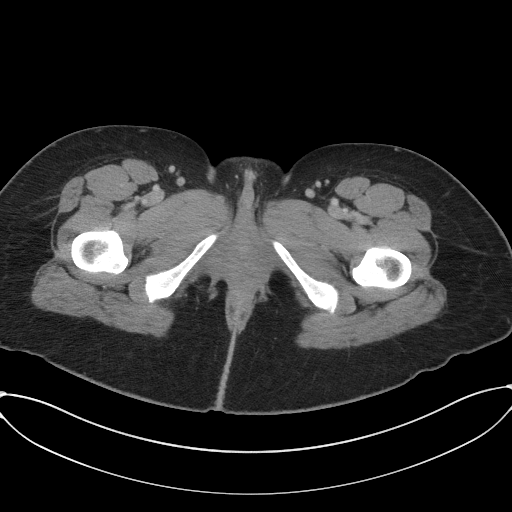
[im 4/84  bone]
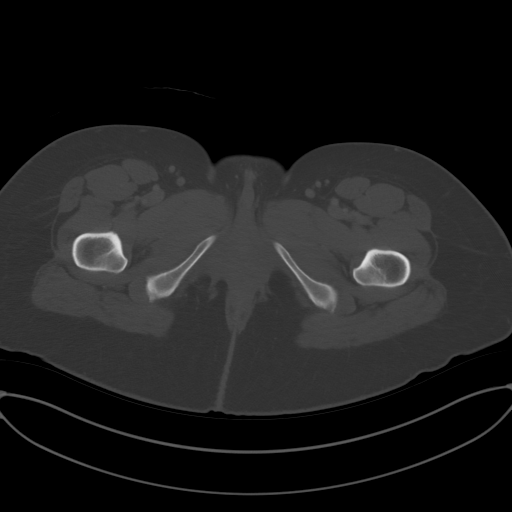
[im 11/84  soft-tissue]
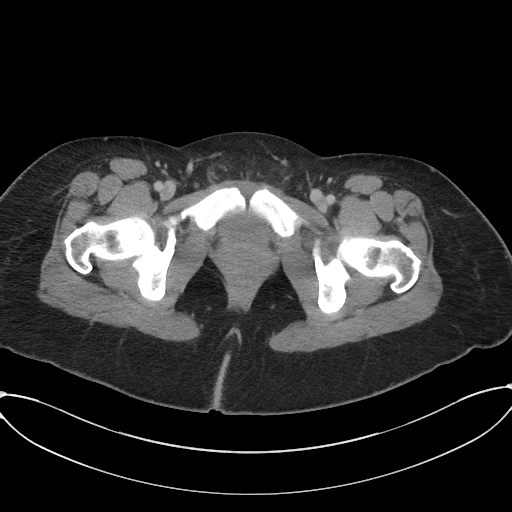
[im 15/84  soft-tissue]
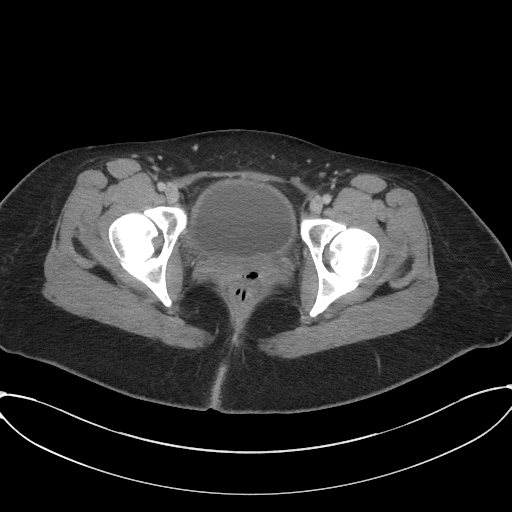
[im 22/84  soft-tissue]
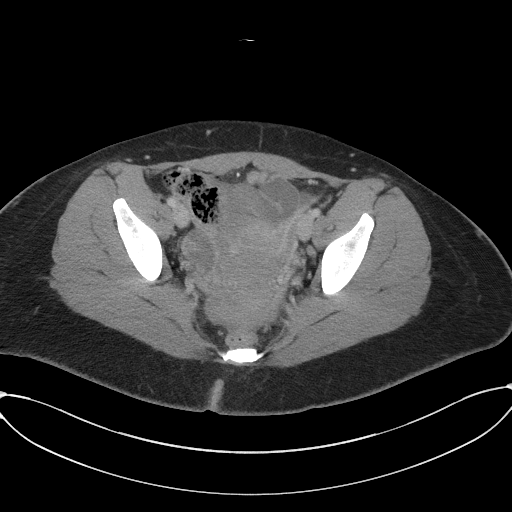
[im 29/84  soft-tissue]
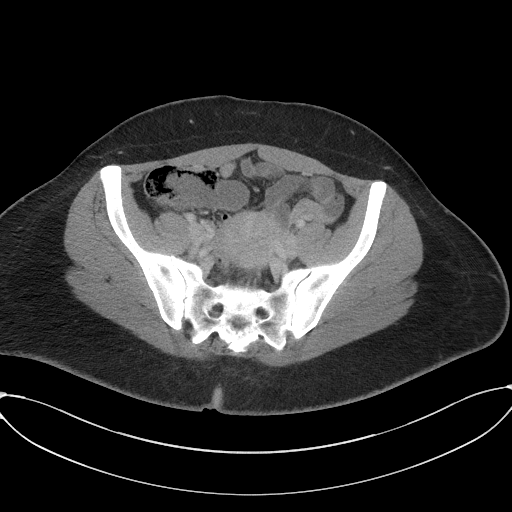
[im 33/84  soft-tissue]
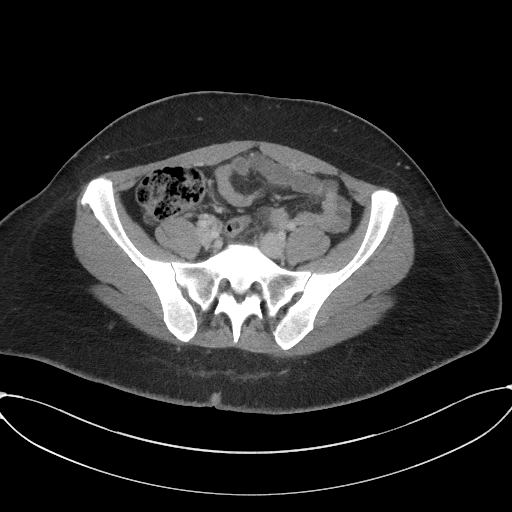
[im 40/84  soft-tissue]
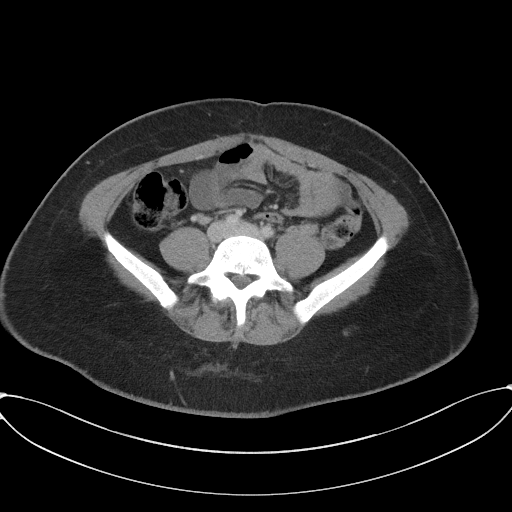
[im 44/84  soft-tissue]
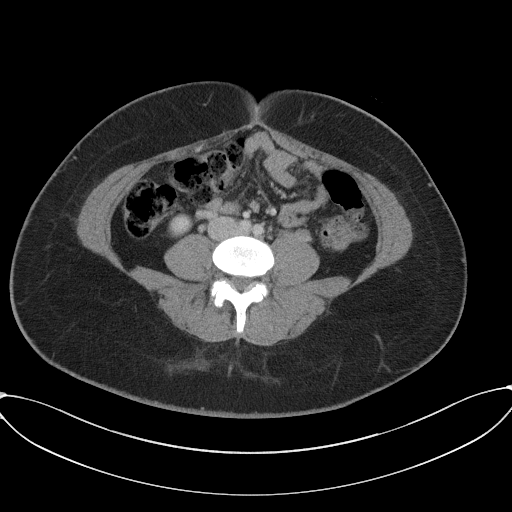
[im 51/84  soft-tissue]
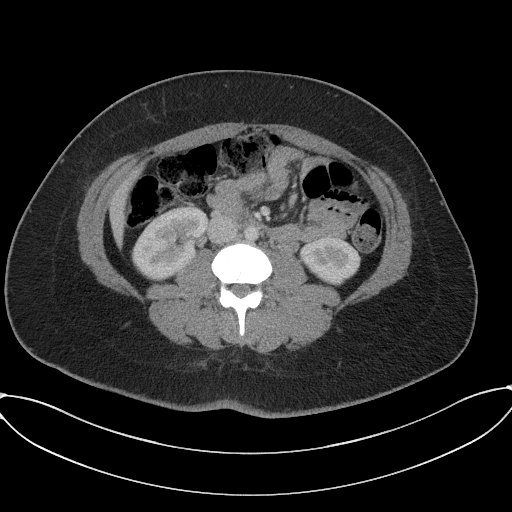
[im 51/84  bone]
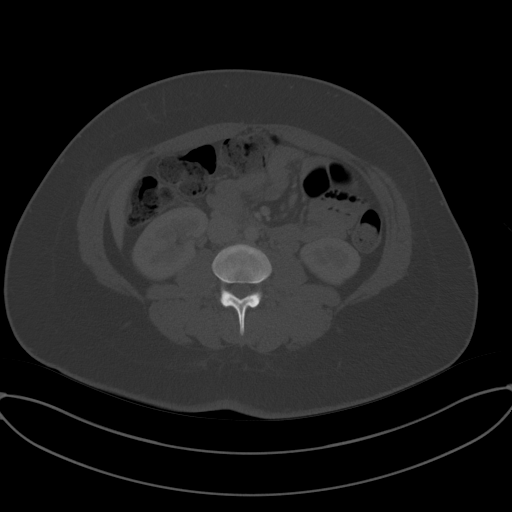
[im 55/84  soft-tissue]
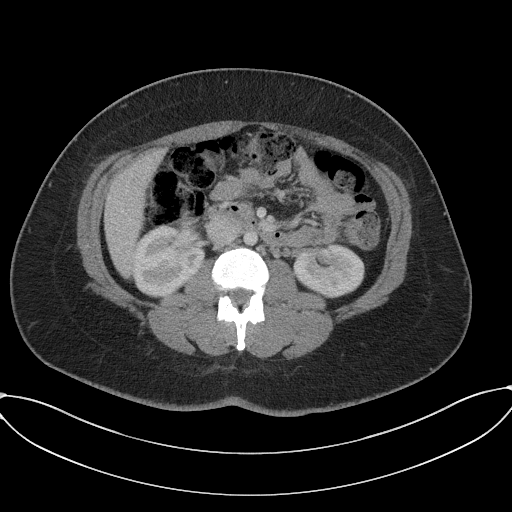
[im 62/84  soft-tissue]
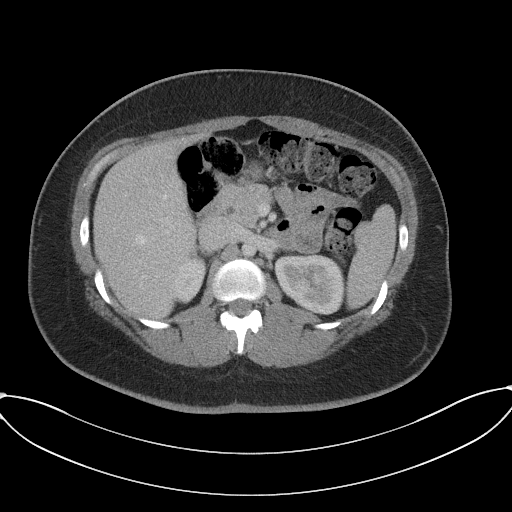
[im 69/84  soft-tissue]
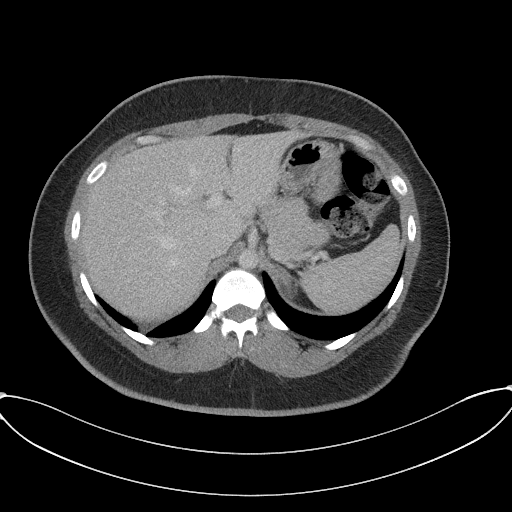
[im 73/84  soft-tissue]
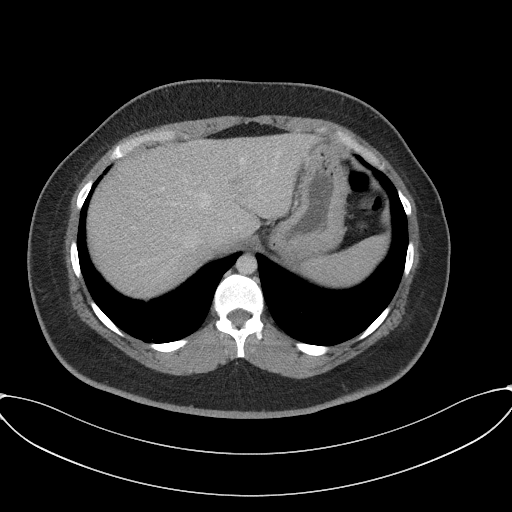
[im 80/84  soft-tissue]
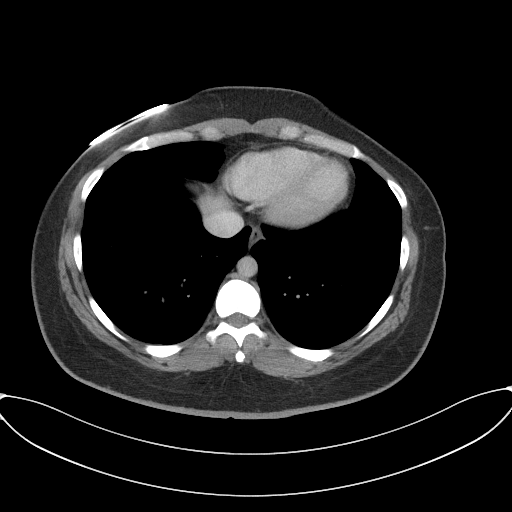

[Series 6: a/p w/ cor · coronal · 0.81mm/px · 3 of 151 slices shown]
[im 51/151  soft-tissue]
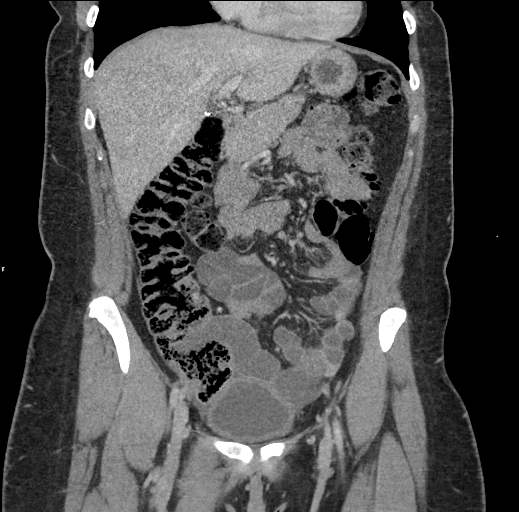
[im 67/151  soft-tissue]
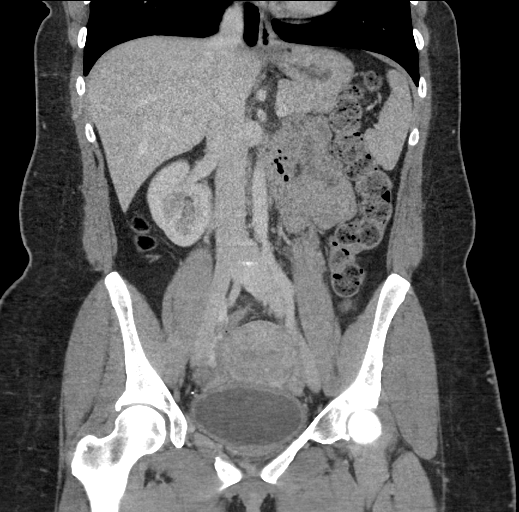
[im 84/151  soft-tissue]
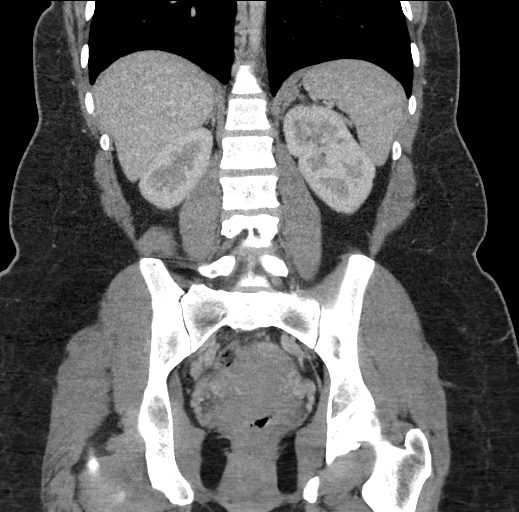

[17 of 46 positions shown; findings below may reference images not displayed]

FINDINGS: Lower chest: The visualized lung bases are clear.

No intra-abdominal free air or free fluid.

Hepatobiliary: The liver is unremarkable. No intrahepatic biliary
dilatation. Cholecystectomy.

Pancreas: Unremarkable. No pancreatic ductal dilatation or
surrounding inflammatory changes.

Spleen: Normal in size without focal abnormality.

Adrenals/Urinary Tract: The adrenal glands unremarkable. The
kidneys, visualized ureters appear unremarkable. There is diffuse
hazy appearance of the bladder wall. Correlation with urinalysis
recommended to evaluate for cystitis.

Stomach/Bowel: There is moderate stool throughout the colon. No
bowel obstruction or active inflammation. The appendix is normal.

Vascular/Lymphatic: The abdominal aorta and IVC unremarkable. No
portal venous gas. There is no adenopathy.

Reproductive: The uterus is anteverted and grossly unremarkable. No
adnexal masses.

Other: None

Musculoskeletal: No acute or significant osseous findings.
IMPRESSION: 1. Hazy appearance of the bladder wall. Correlation with urinalysis
recommended to evaluate for cystitis.
2. No bowel obstruction. Normal appendix.

## 2022-05-23 DIAGNOSIS — F332 Major depressive disorder, recurrent severe without psychotic features: Secondary | ICD-10-CM | POA: Insufficient documentation

## 2022-05-23 DIAGNOSIS — F411 Generalized anxiety disorder: Secondary | ICD-10-CM | POA: Insufficient documentation

## 2022-07-23 ENCOUNTER — Emergency Department (HOSPITAL_COMMUNITY)
Admission: EM | Admit: 2022-07-23 | Discharge: 2022-07-23 | Disposition: A | Discharge status: Home / Self Care | Payer: Medicaid Other | Attending: Emergency Medicine | Admitting: Emergency Medicine

## 2022-07-23 ENCOUNTER — Other Ambulatory Visit: Payer: Self-pay

## 2022-07-23 ENCOUNTER — Encounter (HOSPITAL_COMMUNITY): Payer: Self-pay | Admitting: Emergency Medicine

## 2022-07-23 DIAGNOSIS — S199XXA Unspecified injury of neck, initial encounter: Secondary | ICD-10-CM | POA: Diagnosis present

## 2022-07-23 DIAGNOSIS — Y92410 Unspecified street and highway as the place of occurrence of the external cause: Secondary | ICD-10-CM | POA: Insufficient documentation

## 2022-07-23 DIAGNOSIS — S161XXA Strain of muscle, fascia and tendon at neck level, initial encounter: Secondary | ICD-10-CM | POA: Diagnosis not present

## 2022-07-23 MED ORDER — IBUPROFEN 400 MG PO TABS
600.0000 mg | ORAL_TABLET | ORAL | Status: AC
  Administered 2022-07-23: 600 mg via ORAL
  Filled 2022-07-23: qty 1

## 2022-07-23 MED ORDER — CYCLOBENZAPRINE HCL 10 MG PO TABS: 10.0000 mg | ORAL_TABLET | Freq: Two times a day (BID) | ORAL | 0 refills | Status: DC | PRN

## 2022-07-23 NOTE — ED Notes (Signed)
May return to work on [employer]
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
## 2023-01-09 ENCOUNTER — Encounter: Payer: Self-pay | Admitting: Emergency Medicine

## 2023-01-09 ENCOUNTER — Ambulatory Visit
Admission: EM | Admit: 2023-01-09 | Discharge: 2023-01-09 | Disposition: A | Discharge status: Home / Self Care | Payer: Medicaid Other | Attending: Family Medicine | Admitting: Family Medicine

## 2023-01-09 DIAGNOSIS — J069 Acute upper respiratory infection, unspecified: Secondary | ICD-10-CM | POA: Diagnosis not present

## 2023-01-09 LAB — POC COVID19/FLU A&B COMBO
Covid Antigen, POC: NEGATIVE
Influenza A Antigen, POC: NEGATIVE
Influenza B Antigen, POC: NEGATIVE

## 2023-01-09 MED ORDER — BENZONATATE 200 MG PO CAPS: 200.0000 mg | ORAL_CAPSULE | Freq: Three times a day (TID) | ORAL | 0 refills | Status: AC | PRN

## 2023-01-09 NOTE — ED Triage Notes (Signed)
Symptoms started yesterday. Headache, sore throat, fever, cough, nasal congestion, both daughters are sick with same symptoms.

## 2023-01-09 NOTE — ED Provider Notes (Signed)
[facility]    CSN: [id number] Arrival date & time: 01/09/23  1556      History   Chief Complaint Chief Complaint  Patient presents with   Cough    Family of 3   Sore Throat    HPI Michelle Parks is a 37 y.o. female.    Cough Associated symptoms: fever, headaches, myalgias and rhinorrhea   Associated symptoms: no shortness of breath and no wheezing   Sore Throat Associated symptoms include headaches. Pertinent negatives include no shortness of breath.  Patient is here for URI symptoms that started yesterday. Her two daughters are sick with the same symptoms x 2 days.  Having cough, headache, body aches, fever, runny nose, congestion.  No n/v.  No wheezing or sob.  Has used dayquil at home only.    {The patient has been seen in Urgent Care in the last 3 years. :1}    History reviewed. No pertinent past medical history.  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 Authorizing Provider  busPIRone (BUSPAR) 7.5 MG tablet Take 7.5 mg by mouth 2 (two) times daily. 07/14/22  Yes [provider]  FLUoxetine (PROZAC) 20 MG capsule Take 20 mg by mouth daily. 09/01/22  Yes [provider]  methylPREDNISolone (MEDROL DOSEPAK) 4 MG TBPK tablet Take 4 mg by mouth See admin instructions. 01/31/20  Yes [provider]  methylPREDNISolone (MEDROL DOSEPAK) 4 MG TBPK tablet Take 4 mg by mouth See admin  instructions. 01/31/20  Yes [provider]  brompheniramine-pseudoephedrine-DM 30-2-10 MG/5ML syrup Take 5 mLs by mouth 3 (three) times daily as needed. 01/21/22   Ellsworth Lennox, PA-C  cetirizine (ZYRTEC) 10 MG tablet Take 1 tablet (10 mg total) by mouth daily. 12/12/21   Gustavus Bryant, FNP  cyclobenzaprine (FLEXERIL) 10 MG tablet Take 1 tablet (10 mg total) by mouth 2 (two) times daily as needed for muscle spasms. 07/23/22   Rondel Baton, MD  fluticasone (FLONASE) 50 MCG/ACT nasal spray Place 1 spray into both nostrils daily for 3 days. 12/12/21 12/15/21  Gustavus Bryant, FNP  metroNIDAZOLE (FLAGYL) 500 MG tablet Take 1 tablet (500 mg total) by mouth 2 (two) times daily. 12/14/21   LampteyBritta Mccreedy, MD  nitrofurantoin, macrocrystal-monohydrate, (MACROBID) 100 MG capsule Take 1 capsule (100 mg total) by mouth 2 (two) times daily. 01/21/21   Antony Madura, PA-C    Family History Family History  Problem Relation Age of Onset   Hypertension Mother    Diabetes Mother     Social History Social History   Tobacco Use   Smoking status: Never   Smokeless tobacco:  Never  Vaping Use   Vaping status: Never Used  Substance Use Topics   Alcohol use: Yes    Comment: occasional   Drug use: Never     Allergies   Patient has no known allergies.   Review of Systems Review of Systems  Constitutional:  Positive for fatigue and fever.  HENT:  Positive for congestion and rhinorrhea.   Respiratory:  Positive for cough. Negative for shortness of breath and wheezing.   Cardiovascular: Negative.   Gastrointestinal: Negative.   Musculoskeletal:  Positive for myalgias.  Neurological:  Positive for headaches.  Psychiatric/Behavioral: Negative.       Physical Exam Triage Vital Signs ED Triage Vitals  Encounter Vitals Group     BP 01/09/23 1622 (!) 149/87     Systolic BP Percentile --      Diastolic BP Percentile --      Pulse Rate 01/09/23 1622 (!) 110     Resp 01/09/23 1622 16      Temp 01/09/23 1622 98.6 F (37 C)     Temp Source 01/09/23 1622 Oral     SpO2 01/09/23 1622 98 %     Weight --      Height --      Head Circumference --      Peak Flow --      Pain Score 01/09/23 1623 10     Pain Loc --      Pain Education --      Exclude from Growth Chart --    No data found.  Updated Vital Signs BP (!) 149/87 (BP Location: Left Arm)   Pulse (!) 110   Temp 98.6 F (37 C) (Oral)   Resp 16   LMP 12/26/2022   SpO2 98%   Visual Acuity Right Eye Distance:   Left Eye Distance:   Bilateral Distance:    Right Eye Near:   Left Eye Near:    Bilateral Near:     Physical Exam Constitutional:      Appearance: She is well-developed.  HENT:     Nose: Congestion and rhinorrhea present.     Mouth/Throat:     Mouth: Mucous membranes are moist.     Pharynx: Posterior oropharyngeal erythema present. No pharyngeal swelling or oropharyngeal exudate.     Tonsils: No tonsillar exudate.  Cardiovascular:     Rate and Rhythm: Normal rate and regular rhythm.     Heart sounds: Normal heart sounds.  Pulmonary:     Effort: Pulmonary effort is normal.     Breath sounds: Normal breath sounds.  Musculoskeletal:     Cervical back: Normal range of motion and neck supple.  Lymphadenopathy:     Cervical: No cervical adenopathy.  Skin:    General: Skin is warm.  Neurological:     General: No focal deficit present.     Mental Status: She is alert.  Psychiatric:        Mood and Affect: Mood normal.      UC Treatments / Results  Labs (all labs ordered are listed, but only abnormal results are displayed) Labs Reviewed  POC COVID19/FLU A&B COMBO    EKG   Radiology No results found.  Procedures Procedures (including critical care time)  Medications Ordered in UC Medications - No data to display  Initial Impression / Assessment and Plan / UC Course  I have reviewed the triage vital signs and the nursing notes.  Pertinent labs & imaging results that were  available during my  care of the patient were reviewed by me and considered in my medical decision making (see chart for details).    Final Clinical Impressions(s) / UC Diagnoses   Final diagnoses:  None   Discharge Instructions   None    ED Prescriptions   None    PDMP not reviewed this encounter.

## 2023-01-09 NOTE — Discharge Instructions (Signed)
You were seen today for upper respiratory symptoms.  Your flu and covid was negative.  This is some other virus.  I have sent out a medication to help with cough.  I recommend rest and fluids.  You may use tylenol for pain, fever and body aches.  Please return if you are not improving or worsening.
